# Patient Record
Sex: Male | Born: 1995 | Race: Black or African American | Hispanic: No | Marital: Single | Smoking: Never smoker
Health system: Southern US, Community
[De-identification: ages and names within clinical notes are randomized; demographics above are authoritative.]

---
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
## 2017-04-25 ENCOUNTER — Encounter (HOSPITAL_COMMUNITY): Payer: Self-pay

## 2017-04-25 ENCOUNTER — Emergency Department (HOSPITAL_COMMUNITY)
Admission: EM | Admit: 2017-04-25 | Discharge: 2017-04-25 | Disposition: A | Discharge status: Home / Self Care | Payer: Self-pay | Attending: Emergency Medicine | Admitting: Emergency Medicine

## 2017-04-25 DIAGNOSIS — W268XXA Contact with other sharp object(s), not elsewhere classified, initial encounter: Secondary | ICD-10-CM | POA: Insufficient documentation

## 2017-04-25 DIAGNOSIS — Y939 Activity, unspecified: Secondary | ICD-10-CM | POA: Insufficient documentation

## 2017-04-25 DIAGNOSIS — Y929 Unspecified place or not applicable: Secondary | ICD-10-CM | POA: Insufficient documentation

## 2017-04-25 DIAGNOSIS — Y999 Unspecified external cause status: Secondary | ICD-10-CM | POA: Insufficient documentation

## 2017-04-25 DIAGNOSIS — S0121XA Laceration without foreign body of nose, initial encounter: Secondary | ICD-10-CM | POA: Insufficient documentation

## 2017-04-25 MED ORDER — OXYMETAZOLINE HCL 0.05 % NA SOLN
1.0000 | Freq: Once | NASAL | Status: AC
  Administered 2017-04-25: 1 via NASAL
  Filled 2017-04-25: qty 15

## 2017-04-25 MED ORDER — LIDOCAINE-EPINEPHRINE-TETRACAINE (LET) SOLUTION
3.0000 mL | Freq: Once | NASAL | Status: AC
  Administered 2017-04-25: 3 mL via TOPICAL
  Filled 2017-04-25: qty 3

## 2017-04-25 NOTE — ED Provider Notes (Signed)
[facility]-EMERGENCY DEPT Provider Note   CSN: [id number] Arrival date & time: 04/25/17  2032  By signing my name below, I, Modena JanskyAlbert Thayil, attest that this documentation has been prepared under the direction and in the presence of non-physician practitioner, Kerrie BuffaloHope Latish Toutant, NP. Electronically Signed: Modena JanskyAlbert Thayil, Scribe. 04/25/2017. 9:37 PM.  History   Chief Complaint No chief complaint on file.  The history is provided by the patient. No language interpreter was used.  Facial Injury  Mechanism of injury:  Cut Location:  Nose Pain details:    Severity:  Moderate   Timing:  Constant   Progression:  Unchanged Relieved by:  Nothing Worsened by:  Nothing Ineffective treatments:  None tried Associated symptoms: epistaxis   Associated symptoms: no loss of consciousness, no nausea, no neck pain and no vomiting   Risk factors: no alcohol use    HPI Comments: Johnny Osborne is a 21 y.o. male who presents to the Emergency Department complaining of left-sided nasal injury that occurred today. He states a spring suddenly pop out of a bed while he was getting up from laying down. No head injury or LOC. He has a wound to his left nostril from the spring. No modifiying factors. Immunizations are UTD. Denies any other complaints at this time.  History reviewed. No pertinent past medical history.  There are no active problems to display for this patient.   History reviewed. No pertinent surgical history.     Home Medications    Prior to Admission medications   Not on File    Family History No family history on file.  Social History Social History  Substance Use Topics  . Smoking status: Never Smoker  . Smokeless tobacco: Not on file  . Alcohol use No     Allergies   Patient has no allergy information on record.   Review of Systems Review of Systems  Constitutional: Negative for fever.  HENT: Positive for nosebleeds.   Eyes: Negative for visual disturbance.  Gastrointestinal:  Negative for nausea and vomiting.  Musculoskeletal: Negative for neck pain.  Skin: Positive for wound.  Neurological: Negative for loss of consciousness and syncope.  Psychiatric/Behavioral: The patient is not nervous/anxious.      Physical Exam Updated Vital Signs BP 126/81 (BP Location: Right Arm)   Pulse (!) 102   Temp 98.1 F (36.7 C) (Oral)   Resp 16   SpO2 99%   Physical Exam  Constitutional: He is oriented to person, place, and time. He appears well-developed and well-nourished. No distress.  HENT:  Head: Normocephalic and atraumatic.  Right Ear: Tympanic membrane normal. No hemotympanum.  Left Ear: Tympanic membrane normal. No hemotympanum.  Nose: Mucosal edema present. Epistaxis is observed.    Laceration to the inside of the left nostril.  Eyes: Conjunctivae and EOM are normal. Pupils are equal, round, and reactive to light. No scleral icterus.  Neck: Neck supple.  Cardiovascular: Normal rate.   Pulmonary/Chest: Effort normal.  Musculoskeletal: Normal range of motion.  Neurological: He is alert and oriented to person, place, and time.  Skin: Skin is warm and dry.  Psychiatric: He has a normal mood and affect.  Nursing note and vitals reviewed.    ED Treatments / Results  DIAGNOSTIC STUDIES: Oxygen Saturation is 99% on RA, normal by my interpretation.    COORDINATION OF CARE: 9:41 PM- Pt advised of plan for treatment and pt agrees.  Labs (all labs ordered are listed, but only abnormal results are displayed) Labs Reviewed - No data  to display  Radiology No results found. Dr. Madilyn Hook in to examine the patient. Since the wound is gaping will suture the area that is at the outer portion of the nostril   Procedures .Marland KitchenLaceration Repair Date/Time: 04/25/2017 11:18 PM Performed by: Janne Napoleon Authorized by: Janne Napoleon   Consent:    Consent obtained:  Verbal   Consent given by:  Patient   Risks discussed:  Poor wound healing   Alternatives discussed:   Delayed treatment Laceration details:    Location:  Face   Face location:  Nose Skin repair:    Repair method:  Sutures   Suture size:  6-0   Wound skin closure material used: Vicryl.   Number of sutures:  2 Post-procedure details:    Patient tolerance of procedure:  Tolerated well, no immediate complications   (including critical care time)  Medications Ordered in ED Medications  oxymetazoline (AFRIN) 0.05 % nasal spray 1 spray (1 spray Each Nare Given 04/25/17 2152)  lidocaine-EPINEPHrine-tetracaine (LET) solution (3 mLs Topical Given 04/25/17 2226)     Initial Impression / Assessment and Plan / ED Course  I have reviewed the triage vital signs and the nursing notes.   Final Clinical Impressions(s) / ED Diagnoses  21 y.o. male with laceration to the left nostril stable for d/c without head injury or other problems. Bleeding stopped after sutures applied. Sutures are absorbable he will return if he has any problems.  Final diagnoses:  Laceration of nose, initial encounter    New Prescriptions New Prescriptions   No medications on file   I personally performed the services described in this documentation, which was scribed in my presence. The recorded information has been reviewed and is accurate.     Kerrie Buffalo [place], [place] 04/27/17 [employer]    Tilden Fossa, MD 05/08/17 [phone]

## 2017-04-25 NOTE — ED Notes (Signed)
Declined W/C at D/C and was escorted to lobby by RN. 

## 2017-04-25 NOTE — ED Triage Notes (Signed)
Pt states he was leaning over to get shoes and spring from bed popped out and cut the side of nare; pt has some presents of blood at nare opening but bleeding controlled; pt states pain at 7/10 on arrival; pt a&x 4

## 2018-05-21 ENCOUNTER — Institutional Professional Consult (permissible substitution): Payer: Self-pay | Admitting: Pulmonary Disease

## 2018-12-19 ENCOUNTER — Emergency Department (HOSPITAL_COMMUNITY)
Admission: EM | Admit: 2018-12-19 | Discharge: 2018-12-19 | Disposition: A | Discharge status: Home / Self Care | Payer: Self-pay | Attending: Emergency Medicine | Admitting: Emergency Medicine

## 2018-12-19 ENCOUNTER — Other Ambulatory Visit: Payer: Self-pay

## 2018-12-19 ENCOUNTER — Encounter (HOSPITAL_COMMUNITY): Payer: Self-pay | Admitting: Emergency Medicine

## 2018-12-19 DIAGNOSIS — J069 Acute upper respiratory infection, unspecified: Secondary | ICD-10-CM | POA: Insufficient documentation

## 2018-12-19 MED ORDER — GUAIFENESIN ER 600 MG PO TB12: 600.0000 mg | ORAL_TABLET | Freq: Two times a day (BID) | ORAL | 0 refills | Status: AC

## 2018-12-19 MED ORDER — FLUTICASONE PROPIONATE 50 MCG/ACT NA SUSP: 2.0000 | Freq: Every day | NASAL | 0 refills | Status: AC

## 2018-12-19 NOTE — ED Triage Notes (Signed)
Pt states sinus symptoms, sore throat, and cough since 12/24. Denies fevers. Afebrile in triage.

## 2018-12-19 NOTE — ED Notes (Signed)
Pt verbalized understanding of discharge paperwork and prescriptions.  °

## 2018-12-19 NOTE — ED Provider Notes (Signed)
[facility] Provider Note   CSN: [id number] Arrival date & time: 12/19/18  1410     History   Chief Complaint Chief Complaint  Patient presents with  . Influenza    HPI Johnny Osborne is a 22 y.o. male.  HPI   Pt is a 22 y/o male with no significant PMHx who presents to the ED today c/o rhinorrhea and nasal congestion that has been present for the last 4 days. Also c/o chills. States that a few days ago he had a sore throat but that has resolved. He denies any known fevers. Has had a rare cough. No sob or chest pain. No abd pain. Had some NV a few days ago that is currently resolved. No diarrhea or constipation.   He has tried theraflu and tylenol with some relief. His girlfriend has similar sxs. Sxs have been constant, and are improving. No other exacerbating or alleviating factors.   History reviewed. No pertinent past medical history.  There are no active problems to display for this patient.   History reviewed. No pertinent surgical history.      Home Medications    Prior to Admission medications   Medication Sig Start Date End Date Taking? Authorizing Provider  fluticasone (FLONASE) 50 MCG/ACT nasal spray Place 2 sprays into both nostrils daily. 12/19/18   Maynard David S, PA-C  guaiFENesin (MUCINEX) 600 MG 12 hr tablet Take 1 tablet (600 mg total) by mouth 2 (two) times daily. 12/19/18   Zeva Leber S, PA-C    Family History No family history on file.  Social History Social History   Tobacco Use  . Smoking status: Never Smoker  . Smokeless tobacco: Never Used  Substance Use Topics  . Alcohol use: No  . Drug use: Never     Allergies   Patient has no known allergies.   Review of Systems Review of Systems  Constitutional: Positive for chills. Negative for fever.  HENT: Positive for congestion and rhinorrhea.   Eyes: Negative for visual disturbance.  Respiratory: Positive for cough. Negative for  shortness of breath.   Cardiovascular: Negative for chest pain.  Gastrointestinal: Negative for abdominal pain, constipation, diarrhea and nausea.  Genitourinary: Negative for dysuria.  Musculoskeletal: Negative for back pain.  Neurological: Negative for headaches.     Physical Exam Updated Vital Signs BP 120/77 (BP Location: Right Arm)   Pulse 91   Temp 98.7 F (37.1 C) (Oral)   Resp 18   Ht 5\' 10"  (1.778 m)   Wt 62.6 kg   SpO2 98%   BMI 19.80 kg/m   Physical Exam Vitals signs and nursing note reviewed.  Constitutional:      Appearance: He is well-developed.     Comments: Patient very well-appearing.  Nontoxic and in no acute distress.  HENT:     Head: Normocephalic and atraumatic.     Right Ear: Tympanic membrane normal.     Left Ear: Tympanic membrane normal.     Nose:     Comments: Nasal turbinates are swollen bilaterally, more so on the right.    Mouth/Throat:     Mouth: Mucous membranes are moist.     Pharynx: No oropharyngeal exudate or posterior oropharyngeal erythema.  Eyes:     Extraocular Movements: Extraocular movements intact.     Conjunctiva/sclera: Conjunctivae normal.     Pupils: Pupils are equal, round, and reactive to light.  Neck:     Musculoskeletal: Neck supple.  Cardiovascular:  Rate and Rhythm: Normal rate and regular rhythm.     Heart sounds: Normal heart sounds. No murmur.  Pulmonary:     Effort: Pulmonary effort is normal. No respiratory distress.     Breath sounds: Normal breath sounds. No stridor. No wheezing or rhonchi.  Abdominal:     Palpations: Abdomen is soft.     Tenderness: There is no abdominal tenderness. There is no guarding or rebound.  Skin:    General: Skin is warm and dry.  Neurological:     Mental Status: He is alert.  Psychiatric:        Mood and Affect: Mood normal.      ED Treatments / Results  Labs (all labs ordered are listed, but only abnormal results are displayed) Labs Reviewed - No data to  display  EKG None  Radiology No results found.  Procedures Procedures (including critical care time)  Medications Ordered in ED Medications - No data to display   Initial Impression / Assessment and Plan / ED Course  I have reviewed the triage vital signs and the nursing notes.  Pertinent labs & imaging results that were available during my care of the patient were reviewed by me and considered in my medical decision making (see chart for details).     Final Clinical Impressions(s) / ED Diagnoses   Final diagnoses:  Upper respiratory tract infection, unspecified type   Patients symptoms are consistent with URI, likely viral etiology.  Chest x-ray was not obtained given patient's reports of only having rare cough.  Also reporting that he has no fevers and his symptoms are improving.  Discussed that antibiotics are not indicated for viral infections. Pt will be discharged with symptomatic treatment.  Verbalizes understanding and is agreeable with plan. Pt is hemodynamically stable & in NAD prior to dc.  ED Discharge Orders         Ordered    fluticasone (FLONASE) 50 MCG/ACT nasal spray  Daily     12/19/18 1625    guaiFENesin (MUCINEX) 600 MG 12 hr tablet  2 times daily     12/19/18 1625           Ivon Roedel S, PA-C 12/19/18 1625    Melene PlanFloyd, Dan, DO 12/19/18 1751

## 2018-12-19 NOTE — Discharge Instructions (Signed)

## 2019-11-01 ENCOUNTER — Other Ambulatory Visit: Payer: Self-pay

## 2019-11-01 ENCOUNTER — Emergency Department (HOSPITAL_COMMUNITY)
Admission: EM | Admit: 2019-11-01 | Discharge: 2019-11-01 | Disposition: A | Discharge status: Home / Self Care | Payer: Self-pay | Attending: Emergency Medicine | Admitting: Emergency Medicine

## 2019-11-01 DIAGNOSIS — Z20828 Contact with and (suspected) exposure to other viral communicable diseases: Secondary | ICD-10-CM | POA: Insufficient documentation

## 2019-11-01 DIAGNOSIS — R0981 Nasal congestion: Secondary | ICD-10-CM | POA: Insufficient documentation

## 2019-11-01 DIAGNOSIS — R05 Cough: Secondary | ICD-10-CM | POA: Insufficient documentation

## 2019-11-01 DIAGNOSIS — Z79899 Other long term (current) drug therapy: Secondary | ICD-10-CM | POA: Insufficient documentation

## 2019-11-01 LAB — SARS CORONAVIRUS 2 (TAT 6-24 HRS): SARS Coronavirus 2: NEGATIVE

## 2019-11-01 NOTE — Discharge Instructions (Addendum)
Please read the attachment on how to perform sinus rinse.  Recommend that you perform sinus rinses or use fluticasone (Flonase) for your runny nose and congestion symptoms.  Please take ibuprofen or Tylenol should you develop any fevers or chills.  You may continue your DayQuil and NyQuil, as needed for symptomatic relief.  Please return to the ED or seek medical attention should develop any new or worsening symptoms.   If you live with, or provide care at home for, a person confirmed to have, or being evaluated for, COVID-19 infection please follow these guidelines to prevent infection:  Follow healthcare providers instructions Make sure that you understand and can help the patient follow any healthcare provider instructions for all care.  Provide for the patients basic needs You should help the patient with basic needs in the home and provide support for getting groceries, prescriptions, and other personal needs.  Monitor the patients symptoms If they are getting sicker, call his or her medical provider a  This will help the healthcare providers office take steps to keep other people from getting infected. Ask the healthcare provider to call the local or state health department.  Limit the number of people who have contact with the patient If possible, have only one caregiver for the patient. Other household members should stay in another home or place of residence. If this is not possible, they should stay in another room, or be separated from the patient as much as possible. Use a separate bathroom, if available. Restrict visitors who do not have an essential need to be in the home.  Keep older adults, very young children, and other sick people away from the patient Keep older adults, very young children, and those who have compromised immune systems or chronic health conditions away from the patient. This includes people with chronic heart, lung, or kidney conditions, diabetes,  and cancer.  Ensure good ventilation Make sure that shared spaces in the home have good air flow, such as from an air conditioner or an opened window, weather permitting.  Wash your hands often Wash your hands often and thoroughly with soap and water for at least 20 seconds. You can use an alcohol based hand sanitizer if soap and water are not available and if your hands are not visibly dirty. Avoid touching your eyes, nose, and mouth with unwashed hands. Use disposable paper towels to dry your hands. If not available, use dedicated cloth towels and replace them when they become wet.  Wear a facemask and gloves Wear a disposable facemask at all times in the room and gloves when you touch or have contact with the patients blood, body fluids, and/or secretions or excretions, such as sweat, saliva, sputum, nasal mucus, vomit, urine, or feces.  Ensure the mask fits over your nose and mouth tightly, and do not touch it during use. Throw out disposable facemasks and gloves after using them. Do not reuse. Wash your hands immediately after removing your facemask and gloves. If your personal clothing becomes contaminated, carefully remove clothing and launder. Wash your hands after handling contaminated clothing. Place all used disposable facemasks, gloves, and other waste in a lined container before disposing them with other household waste. Remove gloves and wash your hands immediately after handling these items.  Do not share dishes, glasses, or other household items with the patient Avoid sharing household items. You should not share dishes, drinking glasses, cups, eating utensils, towels, bedding, or other items After the person uses these items, you should  wash them thoroughly with soap and water.  Wash laundry thoroughly Immediately remove and wash clothes or bedding that have blood, body fluids, and/or secretions or excretions, such as sweat, saliva, sputum, nasal mucus, vomit, urine, or  feces, on them. Wear gloves when handling laundry from the patient. Read and follow directions on labels of laundry or clothing items and detergent. In general, wash and dry with the warmest temperatures recommended on the label.  Clean all areas the individual has used often Clean all touchable surfaces, such as counters, tabletops, doorknobs, bathroom fixtures, toilets, phones, keyboards, tablets, and bedside tables, every day. Also, clean any surfaces that may have blood, body fluids, and/or secretions or excretions on them. Wear gloves when cleaning surfaces the patient has come in contact with. Use a diluted bleach solution (e.g., dilute bleach with 1 part bleach and 10 parts water) or a household disinfectant with a label that says EPA-registered for coronaviruses. To make a bleach solution at home, add 1 tablespoon of bleach to 1 quart (4 cups) of water. For a larger supply, add  cup of bleach to 1 gallon (16 cups) of water. Read labels of cleaning products and follow recommendations provided on product labels. Labels contain instructions for safe and effective use of the cleaning product including precautions you should take when applying the product, such as wearing gloves or eye protection and making sure you have good ventilation during use of the product. Remove gloves and wash hands immediately after cleaning.  Monitor yourself for signs and symptoms of illness Caregivers and household members are considered close contacts, should monitor their health, and will be asked to limit movement outside of the home to the extent possible. Follow the monitoring steps for close contacts listed on the symptom monitoring form.   ? If you have additional questions, contact your local health department or call the epidemiologist on call at [phone] (available 24/7). ? This guidance is subject to change. For the most up-to-date guidance from [facility], please refer to their  website: [email or web address]hu

## 2019-11-01 NOTE — ED Provider Notes (Signed)
[place] EMERGENCY DEPARTMENT Provider Note   CSN: [id number] Arrival date & time: 11/01/19  7846     History   Chief Complaint Chief Complaint  Patient presents with  . Nasal Congestion    HPI Johnny Osborne is a 23 y.o. male with no past medical history presents to the ED with a 4-day history of nasal congestion, runny nose, and mild nonproductive cough.  He denies any fevers or chills, headache or dizziness, sore throat, ear pain, nausea or vomiting, changes in bowel habits, or urinary symptoms.  He has been taking DayQuil, with some effect.  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  He want to stay home from work today, but his employer requires a work note which is why he is here for evaluation.     HPI  No past medical history on file.  There are no active problems to display for this patient.   No past surgical history on file.      Home Medications    Prior to Admission medications   Medication Sig Start Date End Date Taking? Authorizing Provider  fluticasone (FLONASE) 50 MCG/ACT nasal spray Place 2 sprays into both nostrils daily. 12/19/18   Couture, Cortni S, PA-C  guaiFENesin (MUCINEX) 600 MG 12 hr tablet Take 1 tablet (600 mg total) by mouth 2 (two) times daily. 12/19/18   Couture, Cortni S, PA-C    Family History No family history on file.  Social History Social History   Tobacco Use  . Smoking status: Never Smoker  . Smokeless tobacco: Never Used  Substance Use Topics  . Alcohol use: No  . Drug use: Never     Allergies   Patient has no known allergies.   Review of Systems Review of Systems  Constitutional: Negative for appetite change, chills, diaphoresis and fever.  HENT: Positive for congestion and rhinorrhea. Negative for sore throat, trouble swallowing and voice change.   Respiratory: Positive for cough.   Gastrointestinal: Negative for nausea and vomiting.     Physical Exam  Updated Vital Signs BP 135/86   Pulse (!) 122   Temp 98.6 F (37 C) (Oral)   Resp 19   SpO2 93%   Physical Exam Vitals signs and nursing note reviewed. Exam conducted with a chaperone present.  Constitutional:      Appearance: Normal appearance.  HENT:     Head: Normocephalic and atraumatic.     Nose: Congestion and rhinorrhea present.     Mouth/Throat:     Mouth: Mucous membranes are moist.     Pharynx: Oropharynx is clear. No oropharyngeal exudate or posterior oropharyngeal erythema.  Eyes:     General: No scleral icterus.    Conjunctiva/sclera: Conjunctivae normal.  Neck:     Musculoskeletal: Normal range of motion and neck supple. No neck rigidity.  Cardiovascular:     Rate and Rhythm: Normal rate and regular rhythm.     Pulses: Normal pulses.     Heart sounds: Normal heart sounds.  Pulmonary:     Effort: Pulmonary effort is normal. No respiratory distress.     Breath sounds: Normal breath sounds.  Abdominal:     General: Abdomen is flat. There is no distension.     Palpations: Abdomen is soft.     Tenderness: There is no abdominal tenderness. There is no guarding.  Skin:    General: Skin is dry.  Neurological:     Mental Status: He is alert.  GCS: GCS eye subscore is 4. GCS verbal subscore is 5. GCS motor subscore is 6.  Psychiatric:        Mood and Affect: Mood normal.        Behavior: Behavior normal.        Thought Content: Thought content normal.      ED Treatments / Results  Labs (all labs ordered are listed, but only abnormal results are displayed) Labs Reviewed  SARS CORONAVIRUS 2 (TAT 6-24 HRS)    EKG None  Radiology No results found.  Procedures Procedures (including critical care time)  Medications Ordered in ED Medications - No data to display   Initial Impression / Assessment and Plan / ED Course  I have reviewed the triage vital signs and the nursing notes.  Pertinent labs & imaging results that were available during my care  of the patient were reviewed by me and considered in my medical decision making (see chart for details).        Patient was tachycardic at triage, but normal rate on my evaluation.  Given his mild cough, offered chest x-ray but patient declined.  His lungs sounded fine on auscultation.  Denies any fevers or chills at home and given the brevity of his symptoms, I am not particularly concerned for pneumonia.   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  Patient has not received the flu vaccine this year, but declined testing.    Encourage patient to get established with a primary care provider soon as possible so that he may receive ongoing evaluation and management.  Recommend that he continue DayQuil and NyQuil, as needed.  Also encouraged him to take Tylenol or ibuprofen should he develop any fevers or chills.  Lastly, encourage patient to perform sinus rinses or consider fluticasone His runny nose and congestion.  Return precautions discussed.  Patient voiced understanding and is agreeable to plan.  Johnny Osborne was evaluated in Emergency Department on 11/01/2019 for the symptoms described in the history of present illness. He was evaluated in the context of the global COVID-19 pandemic, which necessitated consideration that the patient might be at risk for infection with the SARS-CoV-2 virus that causes COVID-19. Institutional protocols and algorithms that pertain to the evaluation of patients at risk for COVID-19 are in a state of rapid change based on information released by regulatory bodies including the CDC and federal and state organizations. These policies and algorithms were followed during the patient's care in the ED.   Final Clinical Impressions(s) / ED Diagnoses   Final diagnoses:  Nasal congestion    ED Discharge Orders    None       Lorelee New, PA-C 11/01/19 [id number]    Tegeler,  Canary Brim, MD 11/01/19 0830

## 2019-11-01 NOTE — ED Triage Notes (Signed)
Patient states that he had a cold and needs a note for work. Denies pain and SOB.

## 2020-01-04 ENCOUNTER — Ambulatory Visit: Payer: Self-pay | Admitting: Allergy

## 2020-01-04 NOTE — Progress Notes (Deleted)
New Patient Note  RE: Johnny Osborne MRN: [id number] DOB: April 11, 1996 Date of Office Visit: 01/04/2020  Referring provider: No ref. provider found Primary care provider: Patient, No Pcp Per  Chief Complaint: No chief complaint on file.  History of Present Illness: I had the pleasure of seeing Johnny Osborne for initial evaluation at the [facility] on 01/04/2020. He is a 24 y.o. male, who is self-referred here for the evaluation of asthma clearance.  He reports symptoms of *** chest tightness, shortness of breath, coughing, wheezing, nocturnal awakenings for *** years. Current medications include *** which help. He reports *** using aerochamber with inhalers. He tried the following inhalers: ***. Main triggers are ***allergies, infections, weather changes, smoke, exercise, pet exposure. In the last month, frequency of symptoms: ***x/week. Frequency of nocturnal symptoms: ***x/month. Frequency of SABA use: ***x/week. Interference with physical activity: ***. Sleep is ***disturbed. In the last 12 months, emergency room visits/urgent care visits/doctor office visits or hospitalizations due to respiratory issues: ***. In the last 12 months, oral steroids courses: ***. Lifetime history of hospitalization for respiratory issues: ***. Prior intubations: ***. Asthma was diagnosed at age *** by ***. History of pneumonia: ***. He was evaluated by allergist ***pulmonologist in the past. Smoking exposure: ***. Up to date with flu vaccine: ***. Up to date with pneumonia vaccine: ***.  History of reflux: ***.  Assessment and Plan: Maor is a 24 y.o. male with: No problem-specific Assessment & Plan notes found for this encounter.  No follow-ups on file.  No orders of the defined types were placed in this encounter.  Lab Orders  No laboratory test(s) ordered today    Other allergy screening: Asthma: {Blank single:19197::"yes","no"} Rhino conjunctivitis: {Blank  single:19197::"yes","no"} Food allergy: {Blank single:19197::"yes","no"} Medication allergy: {Blank single:19197::"yes","no"} Hymenoptera allergy: {Blank single:19197::"yes","no"} Urticaria: {Blank single:19197::"yes","no"} Eczema:{Blank single:19197::"yes","no"} History of recurrent infections suggestive of immunodeficency: {Blank single:19197::"yes","no"}  Diagnostics: Spirometry:  Tracings reviewed. His effort: {Blank single:19197::"Good reproducible efforts.","It was hard to get consistent efforts and there is a question as to whether this reflects a maximal maneuver.","Poor effort, data can not be interpreted."} FVC: ***L FEV1: ***L, ***% predicted FEV1/FVC ratio: ***% Interpretation: {Blank single:19197::"Spirometry consistent with mild obstructive disease","Spirometry consistent with moderate obstructive disease","Spirometry consistent with severe obstructive disease","Spirometry consistent with possible restrictive disease","Spirometry consistent with mixed obstructive and restrictive disease","Spirometry uninterpretable due to technique","Spirometry consistent with normal pattern","No overt abnormalities noted given today's efforts"}.  Please see scanned spirometry results for details.  Skin Testing: {Blank single:19197::"Select foods","Environmental allergy panel","Environmental allergy panel and select foods","Food allergy panel","None","Deferred due to recent antihistamines use"}. Positive test to: ***. Negative test to: ***.  Results discussed with patient/family.   Past Medical History: There are no problems to display for this patient.  No past medical history on file. Past Surgical History: No past surgical history on file. Medication List:  Current Outpatient Medications  Medication Sig Dispense Refill  . fluticasone (FLONASE) 50 MCG/ACT nasal spray Place 2 sprays into both nostrils daily. 16 g 0  . guaiFENesin (MUCINEX) 600 MG 12 hr tablet Take 1 tablet (600 mg  total) by mouth 2 (two) times daily. 6 tablet 0   No current facility-administered medications for this visit.   Allergies: No Known Allergies Social History: Social History   Socioeconomic History  . Marital status: Single    Spouse name: Not on file  . Number of children: Not on file  . Years of education: Not on file  . Highest education level: Not on file  Occupational History  . Not on file  Tobacco Use  . Smoking status: Never Smoker  . Smokeless tobacco: Never Used  Substance and Sexual Activity  . Alcohol use: No  . Drug use: Never  . Sexual activity: Not on file  Other Topics Concern  . Not on file  Social History Narrative  . Not on file   Social Determinants of Health   Financial Resource Strain:   . Difficulty of Paying Living Expenses: Not on file  Food Insecurity:   . Worried About Programme researcher, broadcasting/film/video in the Last Year: Not on file  . Ran Out of Food in the Last Year: Not on file  Transportation Needs:   . Lack of Transportation (Medical): Not on file  . Lack of Transportation (Non-Medical): Not on file  Physical Activity:   . Days of Exercise per Week: Not on file  . Minutes of Exercise per Session: Not on file  Stress:   . Feeling of Stress : Not on file  Social Connections:   . Frequency of Communication with Friends and Family: Not on file  . Frequency of Social Gatherings with Friends and Family: Not on file  . Attends Religious Services: Not on file  . Active Member of Clubs or Organizations: Not on file  . Attends Banker Meetings: Not on file  . Marital Status: Not on file   Lives in a ***. Smoking: *** Occupation: ***  Environmental HistorySurveyor, minerals in the house: Copywriter, advertising in the family room: {Blank single:19197::"yes","no"} Carpet in the bedroom: {Blank single:19197::"yes","no"} Heating: {Blank single:19197::"electric","gas"} Cooling: {Blank  single:19197::"central","window"} Pet: {Blank single:19197::"yes ***","no"}  Family History: No family history on file. Problem                               Relation Asthma                                   *** Eczema                                *** Food allergy                          *** Allergic rhino conjunctivitis     ***  Review of Systems  Constitutional: Negative for appetite change, chills, fever and unexpected weight change.  HENT: Negative for congestion and rhinorrhea.   Eyes: Negative for itching.  Respiratory: Negative for cough, chest tightness, shortness of breath and wheezing.   Cardiovascular: Negative for chest pain.  Gastrointestinal: Negative for abdominal pain.  Genitourinary: Negative for difficulty urinating.  Skin: Negative for rash.  Allergic/Immunologic: Negative for environmental allergies and food allergies.  Neurological: Negative for headaches.   Objective: There were no vitals taken for this visit. There is no height or weight on file to calculate BMI. Physical Exam  Constitutional: He is oriented to person, place, and time. He appears well-developed and well-nourished.  HENT:  Head: Normocephalic and atraumatic.  Right Ear: External ear normal.  Left Ear: External ear normal.  Nose: Nose normal.  Mouth/Throat: Oropharynx is clear and moist.  Eyes: Conjunctivae and EOM are normal.  Cardiovascular: Normal rate, regular rhythm and normal heart sounds. Exam reveals no gallop and no friction  rub.  No murmur heard. Pulmonary/Chest: Effort normal and breath sounds normal. He has no wheezes. He has no rales.  Abdominal: Soft.  Musculoskeletal:     Cervical back: Neck supple.  Neurological: He is alert and oriented to person, place, and time.  Skin: Skin is warm. No rash noted.  Psychiatric: He has a normal mood and affect. His behavior is normal.  Nursing note and vitals reviewed.  The plan was reviewed with the patient/family, and all  questions/concerned were addressed.  It was my pleasure to see Margarito today and participate in his care. Please feel free to contact me with any questions or concerns.  Sincerely,  Rexene Alberts, DO Allergy & Immunology  [facility] office: [phone] [facility] office: [place] office: [phone]

## 2020-12-20 ENCOUNTER — Ambulatory Visit: Payer: Self-pay
